# Patient Record
Sex: Female | Born: 1975 | Race: White | Hispanic: No | Marital: Single | Smoking: Light tobacco smoker
Health system: Southern US, Community
[De-identification: ages and names within clinical notes are randomized; demographics above are authoritative.]

## PROBLEM LIST (undated history)

## (undated) DIAGNOSIS — B977 Papillomavirus as the cause of diseases classified elsewhere: Secondary | ICD-10-CM

## (undated) HISTORY — PX: NO PAST SURGERIES: SHX2092

## (undated) HISTORY — DX: Papillomavirus as the cause of diseases classified elsewhere: B97.7

---

## 2016-04-18 ENCOUNTER — Encounter: Payer: Self-pay | Admitting: Obstetrics and Gynecology

## 2016-04-18 ENCOUNTER — Encounter: Payer: Self-pay | Admitting: Women's Health

## 2016-04-19 ENCOUNTER — Ambulatory Visit (INDEPENDENT_AMBULATORY_CARE_PROVIDER_SITE_OTHER): Payer: BLUE CROSS/BLUE SHIELD | Admitting: Obstetrics and Gynecology

## 2016-04-19 ENCOUNTER — Encounter: Payer: Self-pay | Admitting: Obstetrics and Gynecology

## 2016-04-19 VITALS — BP 104/65 | HR 74 | Ht 65.0 in | Wt 148.2 lb

## 2016-04-19 DIAGNOSIS — Z1231 Encounter for screening mammogram for malignant neoplasm of breast: Secondary | ICD-10-CM

## 2016-04-19 DIAGNOSIS — Z3009 Encounter for other general counseling and advice on contraception: Secondary | ICD-10-CM | POA: Diagnosis not present

## 2016-04-19 DIAGNOSIS — Z803 Family history of malignant neoplasm of breast: Secondary | ICD-10-CM | POA: Diagnosis not present

## 2016-04-19 DIAGNOSIS — Z01419 Encounter for gynecological examination (general) (routine) without abnormal findings: Secondary | ICD-10-CM

## 2016-04-19 DIAGNOSIS — Z1239 Encounter for other screening for malignant neoplasm of breast: Secondary | ICD-10-CM

## 2016-04-19 DIAGNOSIS — Z8041 Family history of malignant neoplasm of ovary: Secondary | ICD-10-CM

## 2016-04-19 NOTE — Progress Notes (Signed)
GYN ENCOUNTER NOTE  Subjective:       Melissa Benjamin is a 40 y.o. 612P1011 female is here for gynecologic evaluation of the following issues:  1. Annual well woman exam: Pt expresses no current concerns.    Menstrual History Interval- every 28 days Duration- 4 days Character- regular, light, no clots, BTB or dysmenorrhea   Gynecologic History Patient's last menstrual period was 03/28/2016. Contraception: withdrawl Last Pap: 01/21/2015. Results were: normal Last mammogram: 01/24/2013. Results were: normal  Obstetric History OB History  Gravida Para Term Preterm AB Living  2 1 1   1 1   SAB TAB Ectopic Multiple Live Births    1     1    # Outcome Date GA Lbr Len/2nd Weight Sex Delivery Anes PTL Lv  2 Term 2008   5 lb 3.2 oz (2.359 kg) F Vag-Spont   LIV  1 TAB 2002              Past Medical History:  Diagnosis Date  . HPV in female     Past Surgical History:  Procedure Laterality Date  . NO PAST SURGERIES      No current outpatient prescriptions on file prior to visit.   No current facility-administered medications on file prior to visit.     No Known Allergies  Social History   Social History  . Marital status: Single    Spouse name: N/A  . Number of children: N/A  . Years of education: N/A   Occupational History  . Not on file.   Social History Main Topics  . Smoking status: Light Tobacco Smoker    Packs/day: 0.25    Types: Cigarettes  . Smokeless tobacco: Never Used  . Alcohol use Yes     Comment: daily- wine  . Drug use: No  . Sexual activity: Yes    Birth control/ protection: None   Other Topics Concern  . Not on file   Social History Narrative  . No narrative on file    Family History  Problem Relation Age of Onset  . Breast cancer Mother   . Ovarian cancer Maternal Grandmother   . Cancer Paternal Grandmother   . Colon cancer Neg Hx   . Diabetes Neg Hx   . Heart disease Neg Hx     The following portions of the patient's history were  reviewed and updated as appropriate: allergies, current medications, past family history, past medical history, past social history, past surgical history and problem list.  Review of Systems Review of Systems - General ROS: negative for - chills, fatigue, fever, hot flashes, malaise or night sweats Hematological and Lymphatic ROS: negative for - bleeding problems or swollen lymph nodes Gastrointestinal ROS: negative for - abdominal pain, blood in stools, change in bowel habits and nausea/vomiting Musculoskeletal ROS: negative for - joint pain, muscle pain or muscular weakness Genito-Urinary ROS: negative for - change in menstrual cycle, dysmenorrhea, dyspareunia, dysuria, genital discharge, genital ulcers, hematuria, incontinence, irregular/heavy menses, nocturia or pelvic pain  Objective:   BP 104/65   Pulse 74   Ht 5\' 5"  (1.651 m)   Wt 148 lb 3.2 oz (67.2 kg)   LMP 03/28/2016   BMI 24.66 kg/m  CONSTITUTIONAL: Well-developed, well-nourished female in no acute distress.  HENT:  Normocephalic, atraumatic.  NECK: Normal range of motion, supple, no masses.  Normal thyroid.  SKIN: Skin is warm and dry. No rash noted. Not diaphoretic. No erythema. No pallor. NEUROLGIC: Alert and oriented to person,  place, and time. PSYCHIATRIC: Normal mood and affect. Normal behavior. Normal judgment and thought content. CARDIOVASCULAR:RRR, no murmurs, rubs or gallops RESPIRATORY: CTABL BREASTS: diffuse nodular fibrocystic changes throughout, no lumps or masses appreciated ABDOMEN: Soft, non distended; Non tender.  No Organomegaly. PELVIC:  External Genitalia: Normal  BUS: Normal  Vagina: Normal  Cervix: Normal; no cervical motion tenderness  Uterus: Normal size, shape,consistency, mobile; retroverted  Adnexa: Normal;nonpalpable and nontender  Bladder: Nontender  Rectovaginal: Normal external exam Extremities: Without clubbing cyanosis or edema      Assessment:   1. Well woman exam with  routine gynecological exam 2. Contraception- withdrawal- discussed other methods of contraception with the patient; decreased risk of ovarian cancer with OCPs but will not consider this method due to patient's smoking status. If patient does not use another form of birth control she should take a vitamin with folic acid as precaution if she were to get pregnant.   3. Family history of breast (mother and paternal grandmother) and ovarian (maternal grandmother) cancer 4. History of LGSIL pap- 2014, 2 negative pap smears since       Plan:   1. Pap IG and HPV (high risk) DNA detection 2. Discussed genetic testing for breast and ovarian cancer 3. Contraception- Discussed benefits and risks of different contraception methods. Educated to take a vitamin with folic acid if she chooses to continue withdrawal method. 4. Glucose, random; Hgb A1c; lipid panel; vit D 25 hydroxy, TSH 5. Counseled on diet, exercise, alcohol consumption and smoking cessation.  6. MM Digital Screening Bilateral- suggested annual screening per last radiology report.   Melissa HerterAnna Parr, PA-S Herold HarmsMartin A Maranda Marte, MD   I have seen, interviewed, and examined the patient in conjunction with the [facility] [employer] [employer] and affirm the diagnosis and management plan. Sinan Tuch A. Phi Avans, MD, FACOG   Note: This dictation was prepared with Dragon dictation along with smaller phrase technology. Any transcriptional errors that result from this process are unintentional.

## 2016-04-19 NOTE — Patient Instructions (Addendum)
1. Pap smear 2. Mammogram ordered 3. Contraception-withdrawal 4. Continue with healthy eating and exercise 5. Screening labs ordered 6. Return in 1 year   Health Maintenance, Female Adopting a healthy lifestyle and getting preventive care can go a long way to promote health and wellness. Talk with your health care provider about what schedule of regular examinations is right for you. This is a good chance for you to check in with your provider about disease prevention and staying healthy. In between checkups, there are plenty of things you can do on your own. Experts have done a lot of research about which lifestyle changes and preventive measures are most likely to keep you healthy. Ask your health care provider for more information. WEIGHT AND DIET  Eat a healthy diet  Be sure to include plenty of vegetables, fruits, low-fat dairy products, and lean protein.  Do not eat a lot of foods high in solid fats, added sugars, or salt.  Get regular exercise. This is one of the most important things you can do for your health.  Most adults should exercise for at least 150 minutes each week. The exercise should increase your heart rate and make you sweat (moderate-intensity exercise).  Most adults should also do strengthening exercises at least twice a week. This is in addition to the moderate-intensity exercise.  Maintain a healthy weight  Body mass index (BMI) is a measurement that can be used to identify possible weight problems. It estimates body fat based on height and weight. Your health care provider can help determine your BMI and help you achieve or maintain a healthy weight.  For females 6 years of age and older:   A BMI below 18.5 is considered underweight.  A BMI of 18.5 to 24.9 is normal.  A BMI of 25 to 29.9 is considered overweight.  A BMI of 30 and above is considered obese.  Watch levels of cholesterol and blood lipids  You should start having your blood tested for  lipids and cholesterol at 38 years of age, then have this test every 5 years.  You may need to have your cholesterol levels checked more often if:  Your lipid or cholesterol levels are high.  You are older than 38 years of age.  You are at high risk for heart disease.  CANCER SCREENING   Lung Cancer  Lung cancer screening is recommended for adults 43-60 years old who are at high risk for lung cancer because of a history of smoking.  A yearly low-dose CT scan of the lungs is recommended for people who:  Currently smoke.  Have quit within the past 15 years.  Have at least a 30-pack-year history of smoking. A pack year is smoking an average of one pack of cigarettes a day for 1 year.  Yearly screening should continue until it has been 15 years since you quit.  Yearly screening should stop if you develop a health problem that would prevent you from having lung cancer treatment.  Breast Cancer  Practice breast self-awareness. This means understanding how your breasts normally appear and feel.  It also means doing regular breast self-exams. Let your health care provider know about any changes, no matter how small.  If you are in your 20s or 30s, you should have a clinical breast exam (CBE) by a health care provider every 1-3 years as part of a regular health exam.  If you are 52 or older, have a CBE every year. Also consider having a breast  X-ray (mammogram) every year.  If you have a family history of breast cancer, talk to your health care provider about genetic screening.  If you are at high risk for breast cancer, talk to your health care provider about having an MRI and a mammogram every year.  Breast cancer gene (BRCA) assessment is recommended for women who have family members with BRCA-related cancers. BRCA-related cancers include:  Breast.  Ovarian.  Tubal.  Peritoneal cancers.  Results of the assessment will determine the need for genetic counseling and BRCA1  and BRCA2 testing. Cervical Cancer Your health care provider may recommend that you be screened regularly for cancer of the pelvic organs (ovaries, uterus, and vagina). This screening involves a pelvic examination, including checking for microscopic changes to the surface of your cervix (Pap test). You may be encouraged to have this screening done every 3 years, beginning at age 8.  For women ages 19-65, health care providers may recommend pelvic exams and Pap testing every 3 years, or they may recommend the Pap and pelvic exam, combined with testing for human papilloma virus (HPV), every 5 years. Some types of HPV increase your risk of cervical cancer. Testing for HPV may also be done on women of any age with unclear Pap test results.  Other health care providers may not recommend any screening for nonpregnant women who are considered low risk for pelvic cancer and who do not have symptoms. Ask your health care provider if a screening pelvic exam is right for you.  If you have had past treatment for cervical cancer or a condition that could lead to cancer, you need Pap tests and screening for cancer for at least 20 years after your treatment. If Pap tests have been discontinued, your risk factors (such as having a new sexual partner) need to be reassessed to determine if screening should resume. Some women have medical problems that increase the chance of getting cervical cancer. In these cases, your health care provider may recommend more frequent screening and Pap tests. Colorectal Cancer  This type of cancer can be detected and often prevented.  Routine colorectal cancer screening usually begins at 38 years of age and continues through 38 years of age.  Your health care provider may recommend screening at an earlier age if you have risk factors for colon cancer.  Your health care provider may also recommend using home test kits to check for hidden blood in the stool.  A small camera at the  end of a tube can be used to examine your colon directly (sigmoidoscopy or colonoscopy). This is done to check for the earliest forms of colorectal cancer.  Routine screening usually begins at age 29.  Direct examination of the colon should be repeated every 5-10 years through 38 years of age. However, you may need to be screened more often if early forms of precancerous polyps or small growths are found. Skin Cancer  Check your skin from head to toe regularly.  Tell your health care provider about any new moles or changes in moles, especially if there is a change in a mole's shape or color.  Also tell your health care provider if you have a mole that is larger than the size of a pencil eraser.  Always use sunscreen. Apply sunscreen liberally and repeatedly throughout the day.  Protect yourself by wearing long sleeves, pants, a wide-brimmed hat, and sunglasses whenever you are outside. HEART DISEASE, DIABETES, AND HIGH BLOOD PRESSURE   High blood pressure causes heart  disease and increases the risk of stroke. High blood pressure is more likely to develop in:  People who have blood pressure in the high end of the normal range (130-139/85-89 mm Hg).  People who are overweight or obese.  People who are African American.  If you are 24-59 years of age, have your blood pressure checked every 3-5 years. If you are 54 years of age or older, have your blood pressure checked every year. You should have your blood pressure measured twice--once when you are at a hospital or clinic, and once when you are not at a hospital or clinic. Record the average of the two measurements. To check your blood pressure when you are not at a hospital or clinic, you can use:  An automated blood pressure machine at a pharmacy.  A home blood pressure monitor.  If you are between 41 years and 59 years old, ask your health care provider if you should take aspirin to prevent strokes.  Have regular diabetes  screenings. This involves taking a blood sample to check your fasting blood sugar level.  If you are at a normal weight and have a low risk for diabetes, have this test once every three years after 38 years of age.  If you are overweight and have a high risk for diabetes, consider being tested at a younger age or more often. PREVENTING INFECTION  Hepatitis B  If you have a higher risk for hepatitis B, you should be screened for this virus. You are considered at high risk for hepatitis B if:  You were born in a country where hepatitis B is common. Ask your health care provider which countries are considered high risk.  Your parents were born in a high-risk country, and you have not been immunized against hepatitis B (hepatitis B vaccine).  You have HIV or AIDS.  You use needles to inject street drugs.  You live with someone who has hepatitis B.  You have had sex with someone who has hepatitis B.  You get hemodialysis treatment.  You take certain medicines for conditions, including cancer, organ transplantation, and autoimmune conditions. Hepatitis C  Blood testing is recommended for:  Everyone born from [birth year] through 1963.  Anyone with known risk factors for hepatitis C. Sexually transmitted infections (STIs)  You should be screened for sexually transmitted infections (STIs) including gonorrhea and chlamydia if:  You are sexually active and are younger than 38 years of age.  You are older than 38 years of age and your health care provider tells you that you are at risk for this type of infection.  Your sexual activity has changed since you were last screened and you are at an increased risk for chlamydia or gonorrhea. Ask your health care provider if you are at risk.  If you do not have HIV, but are at risk, it may be recommended that you take a prescription medicine daily to prevent HIV infection. This is called pre-exposure prophylaxis (PrEP). You are considered at risk  if:  You are sexually active and do not regularly use condoms or know the HIV status of your partner(s).  You take drugs by injection.  You are sexually active with a partner who has HIV. Talk with your health care provider about whether you are at high risk of being infected with HIV. If you choose to begin PrEP, you should first be tested for HIV. You should then be tested every 3 months for as long as you are taking  PrEP.  PREGNANCY   If you are premenopausal and you may become pregnant, ask your health care provider about preconception counseling.  If you may become pregnant, take 400 to 800 micrograms (mcg) of folic acid every day.  If you want to prevent pregnancy, talk to your health care provider about birth control (contraception). OSTEOPOROSIS AND MENOPAUSE   Osteoporosis is a disease in which the bones lose minerals and strength with aging. This can result in serious bone fractures. Your risk for osteoporosis can be identified using a bone density scan.  If you are 18 years of age or older, or if you are at risk for osteoporosis and fractures, ask your health care provider if you should be screened.  Ask your health care provider whether you should take a calcium or vitamin D supplement to lower your risk for osteoporosis.  Menopause may have certain physical symptoms and risks.  Hormone replacement therapy may reduce some of these symptoms and risks. Talk to your health care provider about whether hormone replacement therapy is right for you.  HOME CARE INSTRUCTIONS   Schedule regular health, dental, and eye exams.  Stay current with your immunizations.   Do not use any tobacco products including cigarettes, chewing tobacco, or electronic cigarettes.  If you are pregnant, do not drink alcohol.  If you are breastfeeding, limit how much and how often you drink alcohol.  Limit alcohol intake to no more than 1 drink per day for nonpregnant women. One drink equals 12  ounces of beer, 5 ounces of wine, or 1 ounces of hard liquor.  Do not use street drugs.  Do not share needles.  Ask your health care provider for help if you need support or information about quitting drugs.  Tell your health care provider if you often feel depressed.  Tell your health care provider if you have ever been abused or do not feel safe at home.   This information is not intended to replace advice given to you by your health care provider. Make sure you discuss any questions you have with your health care provider.   Document Released: 12/20/2010 Document Revised: 06/27/2014 Document Reviewed: 05/08/2013 Elsevier Interactive Patient Education [employer].

## 2016-04-19 NOTE — Progress Notes (Signed)
ANNUAL PREVENTATIVE CARE GYN  ENCOUNTER NOTE  Subjective:       Melissa Benjamin is a 40 y.o. 632P1011 female here for a routine annual gynecologic exam.  Current complaints:   see PA-S note   Gynecologic History Patient's last menstrual period was 03/28/2016. Contraception: none Last Pap: 2016. Results were: normal Last mammogram: n/a. Results were: ;  Obstetric History OB History  Gravida Para Term Preterm AB Living  2 1 1   1 1   SAB TAB Ectopic Multiple Live Births    1     1    # Outcome Date GA Lbr Len/2nd Weight Sex Delivery Anes PTL Lv  2 Term 2008   5 lb 3.2 oz (2.359 kg) F Vag-Spont   LIV  1 TAB 2002              Past Medical History:  Diagnosis Date  . HPV in female     Past Surgical History:  Procedure Laterality Date  . NO PAST SURGERIES      No current outpatient prescriptions on file prior to visit.   No current facility-administered medications on file prior to visit.     No Known Allergies  Social History   Social History  . Marital status: Single    Spouse name: N/A  . Number of children: N/A  . Years of education: N/A   Occupational History  . Not on file.   Social History Main Topics  . Smoking status: Not on file  . Smokeless tobacco: Not on file  . Alcohol use Not on file  . Drug use: Unknown  . Sexual activity: Not on file   Other Topics Concern  . Not on file   Social History Narrative  . No narrative on file    No family history on file.  The following portions of the patient's history were reviewed and updated as appropriate: allergies, current medications, past family history, past medical history, past social history, past surgical history and problem list.  Review of Systems ROS Review of Systems - General ROS: negative for - chills, fatigue, fever, hot flashes, night sweats, weight gain or weight loss Psychological ROS: negative for - anxiety, decreased libido, depression, mood swings, physical abuse or sexual  abuse Ophthalmic ROS: negative for - blurry vision, eye pain or loss of vision ENT ROS: negative for - headaches, hearing change, visual changes or vocal changes Allergy and Immunology ROS: negative for - hives, itchy/watery eyes or seasonal allergies Hematological and Lymphatic ROS: negative for - bleeding problems, bruising, swollen lymph nodes or weight loss Endocrine ROS: negative for - galactorrhea, hair pattern changes, hot flashes, malaise/lethargy, mood swings, palpitations, polydipsia/polyuria, skin changes, temperature intolerance or unexpected weight changes Breast ROS: negative for - new or changing breast lumps or nipple discharge Respiratory ROS: negative for - cough or shortness of breath Cardiovascular ROS: negative for - chest pain, irregular heartbeat, palpitations or shortness of breath Gastrointestinal ROS: no abdominal pain, change in bowel habits, or black or bloody stools Genito-Urinary ROS: no dysuria, trouble voiding, or hematuria Musculoskeletal ROS: negative for - joint pain or joint stiffness Neurological ROS: negative for - bowel and bladder control changes Dermatological ROS: negative for rash and skin lesion changes   Objective:   BP 104/65   Pulse 74   Ht 5\' 5"  (1.651 m)   Wt 148 lb 3.2 oz (67.2 kg)   LMP 03/28/2016   BMI 24.66 kg/m  see PA-S note    Assessment:  Annual gynecologic examination 40 y.o. Contraception: none Normal BMI Family history of breast cancer Family history ovarian cancer Contraception-withdrawal Tobacco user  Plan:  Pap: Pap Co Test Mammogram: due age 40 Stool Guaiac Testing:  Not indicated Labs: Lipid 1, FBS, TSH, Hemoglobin A1C and Vit D Level"". Routine preventative health maintenance measures emphasized: Exercise/Diet/Weight control, Tobacco Warnings, Alcohol/Substance use risks and Safe Sex [address]   Melissa Benjamin, CMA  Melissa HarmsMartin A Jady Braggs, MD  Note: This dictation was prepared with  Dragon dictation along with smaller phrase technology. Any transcriptional errors that result from this process are unintentional.  Please note: this is a duplicate note.for complete details go to PA-S documentation

## 2016-04-23 LAB — PAP IG AND HPV HIGH-RISK
HPV, HIGH-RISK: NEGATIVE
PAP SMEAR COMMENT: 0

## 2017-04-13 NOTE — Progress Notes (Signed)
GYN ENCOUNTER NOTE  Subjective:       Melissa Benjamin is a [age] y.o. 642P1011 female is here for gynecologic evaluation of the following issues:  1. Annual well woman exam: Pt expresses no current concerns.    Menstrual History Interval- every 28 days Duration- 4 days Character- regular, light, no clots, BTB or dysmenorrhea   Gynecologic History lmp- 03/28/2017 Contraception: Condoms (monogamous times 17 years) Last Pap: 04/19/2016 neg/neg Results were: normal Last mammogram: 01/24/2013. Results were: normal  Obstetric History OB History  Gravida Para Term Preterm AB Living  2 1 1   1 1   SAB TAB Ectopic Multiple Live Births    1     1    # Outcome Date GA Lbr Len/2nd Weight Sex Delivery Anes PTL Lv  2 Term 2008   5 lb 3.2 oz (2.359 kg) F Vag-Spont   LIV  1 TAB 2002              Past Medical History:  Diagnosis Date  . HPV in female     Past Surgical History:  Procedure Laterality Date  . NO PAST SURGERIES      No current outpatient prescriptions on file prior to visit.   No current facility-administered medications on file prior to visit.     No Known Allergies  Social History   Social History  . Marital status: Single    Spouse name: N/A  . Number of children: N/A  . Years of education: N/A   Occupational History  . Not on file.   Social History Main Topics  . Smoking status: Light Tobacco Smoker    Packs/day: 0.25    Types: Cigarettes  . Smokeless tobacco: Never Used  . Alcohol use Yes     Comment: daily- wine  . Drug use: No  . Sexual activity: Yes    Birth control/ protection: None   Other Topics Concern  . Not on file   Social History Narrative  . No narrative on file    Family History  Problem Relation Age of Onset  . Breast cancer Mother   . Ovarian cancer Maternal Grandmother   . Cancer Paternal Grandmother   . Colon cancer Neg Hx   . Diabetes Neg Hx   . Heart disease Neg Hx     The following portions of the patient's history  were reviewed and updated as appropriate: allergies, current medications, past family history, past medical history, past social history, past surgical history and problem list.  Review of Systems Review of Systems  Constitutional: Negative.   HENT: Negative.   Eyes: Negative.   Respiratory: Negative.   Cardiovascular: Negative.   Gastrointestinal: Negative.   Genitourinary: Negative.   Musculoskeletal: Negative.   Skin: Negative.   Neurological: Negative.   Endo/Heme/Allergies: Negative.   Psychiatric/Behavioral: Negative.        Objective:   BP 106/72   Pulse 71   Ht 5\' 9"  (1.753 m)   Wt 152 lb 1.6 oz (69 kg)   LMP 03/28/2017 (Exact Date)   BMI 22.46 kg/m  CONSTITUTIONAL: Well-developed, well-nourished female in no acute distress.  HENT:  Normocephalic, atraumatic.  NECK: Normal range of motion, supple, no masses.  Normal thyroid.  SKIN: Skin is warm and dry. No rash noted. Not diaphoretic. No erythema. No pallor. NEUROLGIC: Alert and oriented to person, place, and time. PSYCHIATRIC: Normal mood and affect. Normal behavior. Normal judgment and thought content. CARDIOVASCULAR:RRR, no murmurs, rubs or gallops RESPIRATORY: CTABL BREASTS:  diffuse nodular fibrocystic changes throughout, no lumps or masses appreciated ABDOMEN: Soft, non distended; Non tender.  No Organomegaly. PELVIC:  External Genitalia: Normal  BUS: Normal  Vagina: Normal  Cervix: Normal; no cervical motion tenderness  Uterus: Normal size, shape,consistency, mobile; retroverted  Adnexa: Normal;nonpalpable and nontender  Bladder: Nontender  Rectovaginal: Normal external exam Extremities: Without clubbing cyanosis or edema      Assessment:   1. Well woman exam with routine gynecological exam 2. Contraception-condoms/withdrawal-  3. Family history of breast (mother and paternal grandmother) and ovarian (maternal grandmother) cancer 4. History of LGSIL pap- 2014, 2 negative pap smears since        Plan:   1. Due 2020 2. Discussed genetic testing for breast and ovarian cancer; patient to contact us to schedule blood work 3. Contraception- Discussed benefits and risks of different contraception methods. Educated to take a vitamin with folic acid if she chooses to continue withdrawal method. 4. Labs - declined 5. Counseled on diet, exercise, alcohol consumption and smoking cessation.  6. MM Digital Screening Bilateral- suggested annual screening per last radiology report.  Darol Destine, CMA  Herold Harms, MD  Note: This dictation was prepared with Dragon dictation along with smaller phrase technology. Any transcriptional errors that result from this process are unintentional.

## 2017-04-20 ENCOUNTER — Ambulatory Visit (INDEPENDENT_AMBULATORY_CARE_PROVIDER_SITE_OTHER): Payer: BLUE CROSS/BLUE SHIELD | Admitting: Obstetrics and Gynecology

## 2017-04-20 ENCOUNTER — Encounter: Payer: Self-pay | Admitting: Obstetrics and Gynecology

## 2017-04-20 VITALS — BP 106/72 | HR 71 | Ht 69.0 in | Wt 152.1 lb

## 2017-04-20 DIAGNOSIS — Z01419 Encounter for gynecological examination (general) (routine) without abnormal findings: Secondary | ICD-10-CM | POA: Diagnosis not present

## 2017-04-20 DIAGNOSIS — Z803 Family history of malignant neoplasm of breast: Secondary | ICD-10-CM | POA: Diagnosis not present

## 2017-04-20 DIAGNOSIS — Z1231 Encounter for screening mammogram for malignant neoplasm of breast: Secondary | ICD-10-CM | POA: Diagnosis not present

## 2017-04-20 DIAGNOSIS — Z8041 Family history of malignant neoplasm of ovary: Secondary | ICD-10-CM | POA: Diagnosis not present

## 2017-04-20 DIAGNOSIS — Z1239 Encounter for other screening for malignant neoplasm of breast: Secondary | ICD-10-CM

## 2017-04-20 NOTE — Patient Instructions (Signed)
1.  No Pap smear today.  Next Pap smear is due in 2020 2.  Mammogram is ordered 3.  Screening labs are declined this year 4.  Genetic testing for breast cancer and ovarian cancer was reviewed.  Patient is to call us with her decision about which company to do the testing with 5.  Continue with healthy eating and exercise 6.  Return in 1 year for annual exam  Health Maintenance, Female Adopting a healthy lifestyle and getting preventive care can go a long way to promote health and wellness. Talk with your health care provider about what schedule of regular examinations is right for you. This is a good chance for you to check in with your provider about disease prevention and staying healthy. In between checkups, there are plenty of things you can do on your own. Experts have done a lot of research about which lifestyle changes and preventive measures are most likely to keep you healthy. Ask your health care provider for more information. Weight and diet Eat a healthy diet  Be sure to include plenty of vegetables, fruits, low-fat dairy products, and lean protein.  Do not eat a lot of foods high in solid fats, added sugars, or salt.  Get regular exercise. This is one of the most important things you can do for your health. ? Most adults should exercise for at least 150 minutes each week. The exercise should increase your heart rate and make you sweat (moderate-intensity exercise). ? Most adults should also do strengthening exercises at least twice a week. This is in addition to the moderate-intensity exercise.  Maintain a healthy weight  Body mass index (BMI) is a measurement that can be used to identify possible weight problems. It estimates body fat based on height and weight. Your health care provider can help determine your BMI and help you achieve or maintain a healthy weight.  For females 25 years of age and older: ? A BMI below 18.5 is considered underweight. ? A BMI of 18.5 to 24.9 is  normal. ? A BMI of 25 to 29.9 is considered overweight. ? A BMI of 30 and above is considered obese.  Watch levels of cholesterol and blood lipids  You should start having your blood tested for lipids and cholesterol at 39 years of age, then have this test every 5 years.  You may need to have your cholesterol levels checked more often if: ? Your lipid or cholesterol levels are high. ? You are older than 39 years of age. ? You are at high risk for heart disease.  Cancer screening Lung Cancer  Lung cancer screening is recommended for adults 77-2 years old who are at high risk for lung cancer because of a history of smoking.  A yearly low-dose CT scan of the lungs is recommended for people who: ? Currently smoke. ? Have quit within the past 15 years. ? Have at least a 30-pack-year history of smoking. A pack year is smoking an average of one pack of cigarettes a day for 1 year.  Yearly screening should continue until it has been 15 years since you quit.  Yearly screening should stop if you develop a health problem that would prevent you from having lung cancer treatment.  Breast Cancer  Practice breast self-awareness. This means understanding how your breasts normally appear and feel.  It also means doing regular breast self-exams. Let your health care provider know about any changes, no matter how small.  If you are in  your 20s or 30s, you should have a clinical breast exam (CBE) by a health care provider every 1-3 years as part of a regular health exam.  If you are 70 or older, have a CBE every year. Also consider having a breast X-ray (mammogram) every year.  If you have a family history of breast cancer, talk to your health care provider about genetic screening.  If you are at high risk for breast cancer, talk to your health care provider about having an MRI and a mammogram every year.  Breast cancer gene (BRCA) assessment is recommended for women who have family members  with BRCA-related cancers. BRCA-related cancers include: ? Breast. ? Ovarian. ? Tubal. ? Peritoneal cancers.  Results of the assessment will determine the need for genetic counseling and BRCA1 and BRCA2 testing.  Cervical Cancer Your health care provider may recommend that you be screened regularly for cancer of the pelvic organs (ovaries, uterus, and vagina). This screening involves a pelvic examination, including checking for microscopic changes to the surface of your cervix (Pap test). You may be encouraged to have this screening done every 3 years, beginning at age 70.  For women ages 38-65, health care providers may recommend pelvic exams and Pap testing every 3 years, or they may recommend the Pap and pelvic exam, combined with testing for human papilloma virus (HPV), every 5 years. Some types of HPV increase your risk of cervical cancer. Testing for HPV may also be done on women of any age with unclear Pap test results.  Other health care providers may not recommend any screening for nonpregnant women who are considered low risk for pelvic cancer and who do not have symptoms. Ask your health care provider if a screening pelvic exam is right for you.  If you have had past treatment for cervical cancer or a condition that could lead to cancer, you need Pap tests and screening for cancer for at least 20 years after your treatment. If Pap tests have been discontinued, your risk factors (such as having a new sexual partner) need to be reassessed to determine if screening should resume. Some women have medical problems that increase the chance of getting cervical cancer. In these cases, your health care provider may recommend more frequent screening and Pap tests.  Colorectal Cancer  This type of cancer can be detected and often prevented.  Routine colorectal cancer screening usually begins at 39 years of age and continues through 39 years of age.  Your health care provider may recommend  screening at an earlier age if you have risk factors for colon cancer.  Your health care provider may also recommend using home test kits to check for hidden blood in the stool.  A small camera at the end of a tube can be used to examine your colon directly (sigmoidoscopy or colonoscopy). This is done to check for the earliest forms of colorectal cancer.  Routine screening usually begins at age 64.  Direct examination of the colon should be repeated every 5-10 years through 39 years of age. However, you may need to be screened more often if early forms of precancerous polyps or small growths are found.  Skin Cancer  Check your skin from head to toe regularly.  Tell your health care provider about any new moles or changes in moles, especially if there is a change in a mole's shape or color.  Also tell your health care provider if you have a mole that is larger than the size  of a pencil eraser.  Always use sunscreen. Apply sunscreen liberally and repeatedly throughout the day.  Protect yourself by wearing long sleeves, pants, a wide-brimmed hat, and sunglasses whenever you are outside.  Heart disease, diabetes, and high blood pressure  High blood pressure causes heart disease and increases the risk of stroke. High blood pressure is more likely to develop in: ? People who have blood pressure in the high end of the normal range (130-139/85-89 mm Hg). ? People who are overweight or obese. ? People who are African American.  If you are 70-8 years of age, have your blood pressure checked every 3-5 years. If you are 71 years of age or older, have your blood pressure checked every year. You should have your blood pressure measured twice-once when you are at a hospital or clinic, and once when you are not at a hospital or clinic. Record the average of the two measurements. To check your blood pressure when you are not at a hospital or clinic, you can use: ? An automated blood pressure machine at  a pharmacy. ? A home blood pressure monitor.  If you are between 52 years and 67 years old, ask your health care provider if you should take aspirin to prevent strokes.  Have regular diabetes screenings. This involves taking a blood sample to check your fasting blood sugar level. ? If you are at a normal weight and have a low risk for diabetes, have this test once every three years after 39 years of age. ? If you are overweight and have a high risk for diabetes, consider being tested at a younger age or more often. Preventing infection Hepatitis B  If you have a higher risk for hepatitis B, you should be screened for this virus. You are considered at high risk for hepatitis B if: ? You were born in a country where hepatitis B is common. Ask your health care provider which countries are considered high risk. ? Your parents were born in a high-risk country, and you have not been immunized against hepatitis B (hepatitis B vaccine). ? You have HIV or AIDS. ? You use needles to inject street drugs. ? You live with someone who has hepatitis B. ? You have had sex with someone who has hepatitis B. ? You get hemodialysis treatment. ? You take certain medicines for conditions, including cancer, organ transplantation, and autoimmune conditions.  Hepatitis C  Blood testing is recommended for: ? Everyone born from 1 through 1963. ? Anyone with known risk factors for hepatitis C.  Sexually transmitted infections (STIs)  You should be screened for sexually transmitted infections (STIs) including gonorrhea and chlamydia if: ? You are sexually active and are younger than 39 years of age. ? You are older than 39 years of age and your health care provider tells you that you are at risk for this type of infection. ? Your sexual activity has changed since you were last screened and you are at an increased risk for chlamydia or gonorrhea. Ask your health care provider if you are at risk.  If you do  not have HIV, but are at risk, it may be recommended that you take a prescription medicine daily to prevent HIV infection. This is called pre-exposure prophylaxis (PrEP). You are considered at risk if: ? You are sexually active and do not regularly use condoms or know the HIV status of your partner(s). ? You take drugs by injection. ? You are sexually active with a partner who  has HIV.  Talk with your health care provider about whether you are at high risk of being infected with HIV. If you choose to begin PrEP, you should first be tested for HIV. You should then be tested every 3 months for as long as you are taking PrEP. Pregnancy  If you are premenopausal and you may become pregnant, ask your health care provider about preconception counseling.  If you may become pregnant, take 400 to 800 micrograms (mcg) of folic acid every day.  If you want to prevent pregnancy, talk to your health care provider about birth control (contraception). Osteoporosis and menopause  Osteoporosis is a disease in which the bones lose minerals and strength with aging. This can result in serious bone fractures. Your risk for osteoporosis can be identified using a bone density scan.  If you are 40 years of age or older, or if you are at risk for osteoporosis and fractures, ask your health care provider if you should be screened.  Ask your health care provider whether you should take a calcium or vitamin D supplement to lower your risk for osteoporosis.  Menopause may have certain physical symptoms and risks.  Hormone replacement therapy may reduce some of these symptoms and risks. Talk to your health care provider about whether hormone replacement therapy is right for you. Follow these instructions at home:  Schedule regular health, dental, and eye exams.  Stay current with your immunizations.  Do not use any tobacco products including cigarettes, chewing tobacco, or electronic cigarettes.  If you are  pregnant, do not drink alcohol.  If you are breastfeeding, limit how much and how often you drink alcohol.  Limit alcohol intake to no more than 1 drink per day for nonpregnant women. One drink equals 12 ounces of beer, 5 ounces of wine, or 1 ounces of hard liquor.  Do not use street drugs.  Do not share needles.  Ask your health care provider for help if you need support or information about quitting drugs.  Tell your health care provider if you often feel depressed.  Tell your health care provider if you have ever been abused or do not feel safe at home. This information is not intended to replace advice given to you by your health care provider. Make sure you discuss any questions you have with your health care provider. Document Released: 12/20/2010 Document Revised: 11/12/2015 Document Reviewed: 03/10/2015 Elsevier Interactive Patient Education  [employer].

## 2018-01-30 ENCOUNTER — Encounter: Payer: Self-pay | Admitting: Radiology

## 2018-01-30 ENCOUNTER — Ambulatory Visit
Admission: RE | Admit: 2018-01-30 | Discharge: 2018-01-30 | Disposition: A | Discharge status: Home / Self Care | Payer: BLUE CROSS/BLUE SHIELD | Source: Ambulatory Visit | Attending: Obstetrics and Gynecology | Admitting: Obstetrics and Gynecology

## 2018-01-30 DIAGNOSIS — Z1231 Encounter for screening mammogram for malignant neoplasm of breast: Secondary | ICD-10-CM | POA: Diagnosis not present

## 2018-01-30 DIAGNOSIS — Z1239 Encounter for other screening for malignant neoplasm of breast: Secondary | ICD-10-CM

## 2018-01-31 ENCOUNTER — Other Ambulatory Visit: Payer: Self-pay | Admitting: Obstetrics and Gynecology

## 2018-01-31 DIAGNOSIS — N6310 Unspecified lump in the right breast, unspecified quadrant: Secondary | ICD-10-CM

## 2018-01-31 DIAGNOSIS — R928 Other abnormal and inconclusive findings on diagnostic imaging of breast: Secondary | ICD-10-CM

## 2018-02-13 ENCOUNTER — Ambulatory Visit
Admission: RE | Admit: 2018-02-13 | Discharge: 2018-02-13 | Disposition: A | Discharge status: Home / Self Care | Payer: BLUE CROSS/BLUE SHIELD | Source: Ambulatory Visit | Attending: Obstetrics and Gynecology | Admitting: Obstetrics and Gynecology

## 2018-02-13 DIAGNOSIS — R928 Other abnormal and inconclusive findings on diagnostic imaging of breast: Secondary | ICD-10-CM | POA: Diagnosis not present

## 2018-02-13 DIAGNOSIS — N6001 Solitary cyst of right breast: Secondary | ICD-10-CM | POA: Diagnosis not present

## 2018-02-13 DIAGNOSIS — N6310 Unspecified lump in the right breast, unspecified quadrant: Secondary | ICD-10-CM

## 2018-04-24 ENCOUNTER — Encounter: Payer: BLUE CROSS/BLUE SHIELD | Admitting: Obstetrics and Gynecology

## 2018-11-03 IMAGING — MG MM DIGITAL DIAGNOSTIC UNILAT*R* W/ TOMO W/ CAD
4 series · 4 of 12 positions shown · non-contrast
Comparison: 01/30/2018

CLINICAL DATA: 42-year-old patient recalled from recent screening
mammogram for evaluation of possible mass in the medial right
breast.

EXAM:
DIGITAL DIAGNOSTIC RIGHT MAMMOGRAM WITH CAD AND TOMO
ULTRASOUND RIGHT BREAST

[R CC synth-2D]
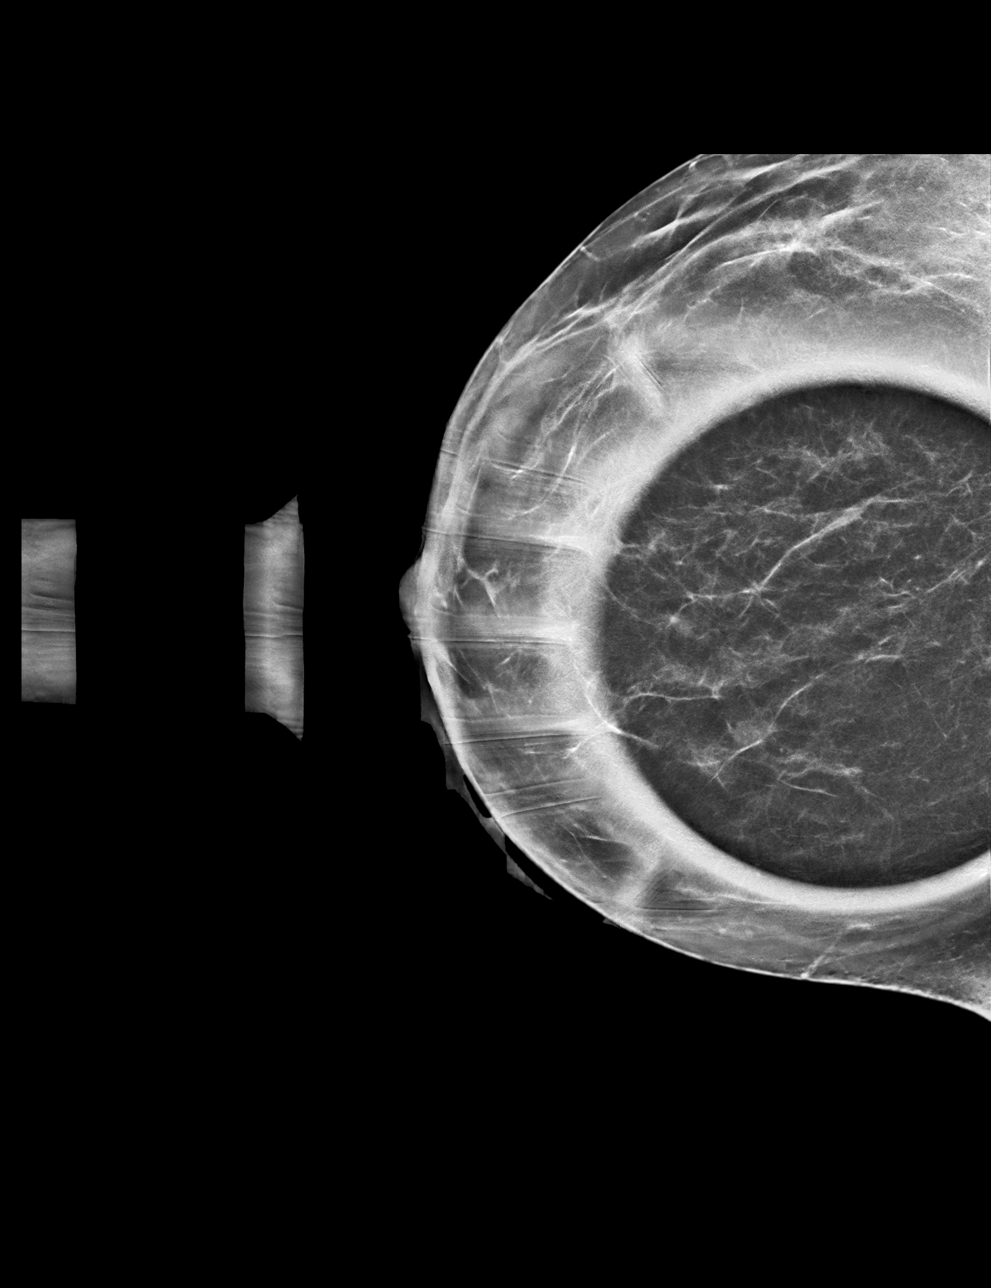

[R ML synth-2D]
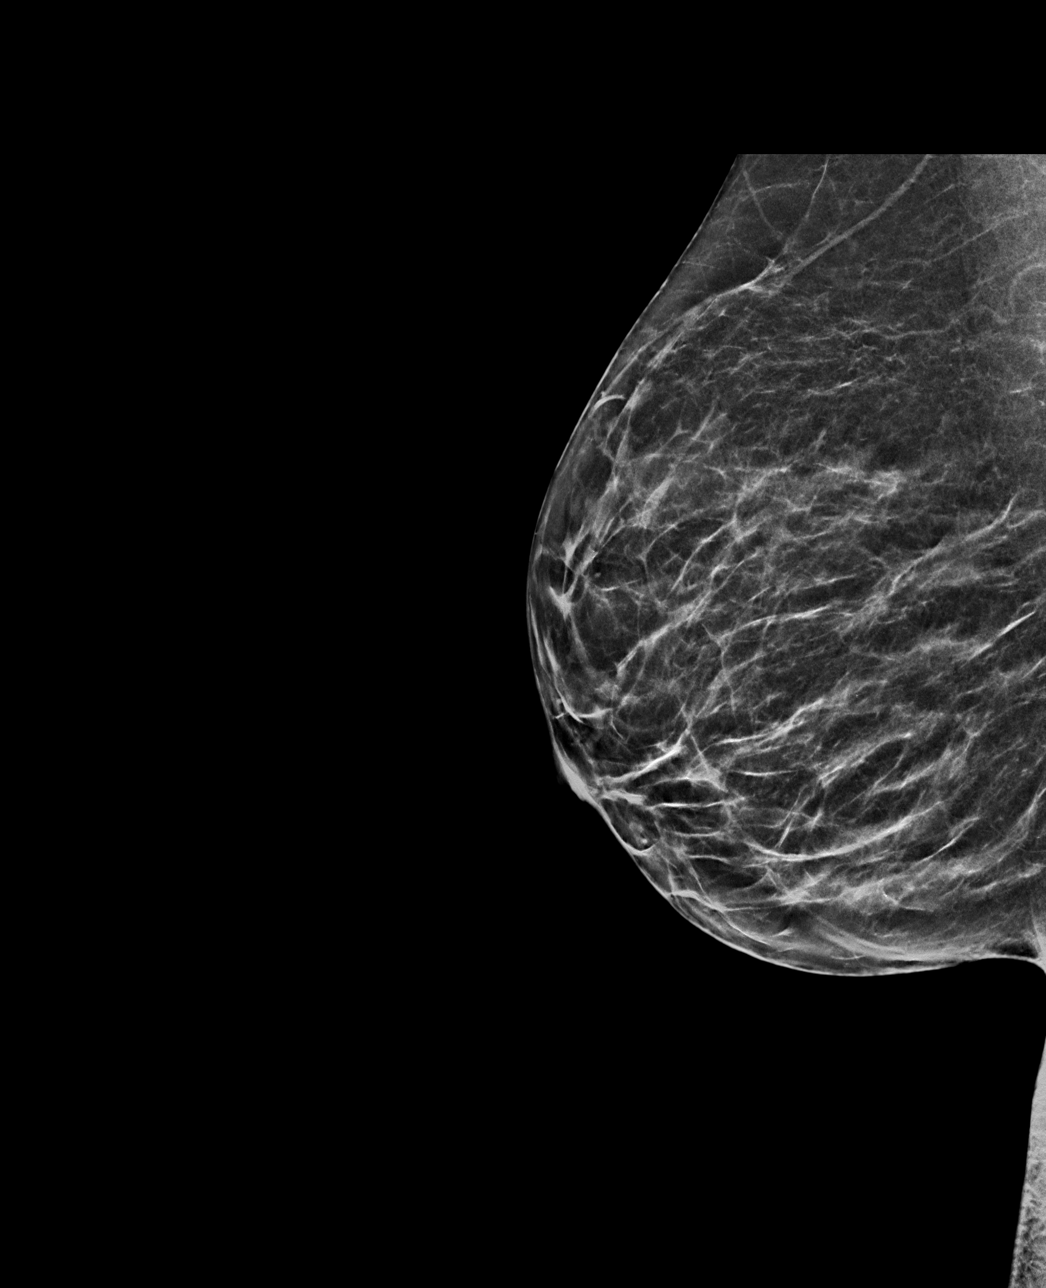

[R CC tomo · tomo slice 29/56.0]
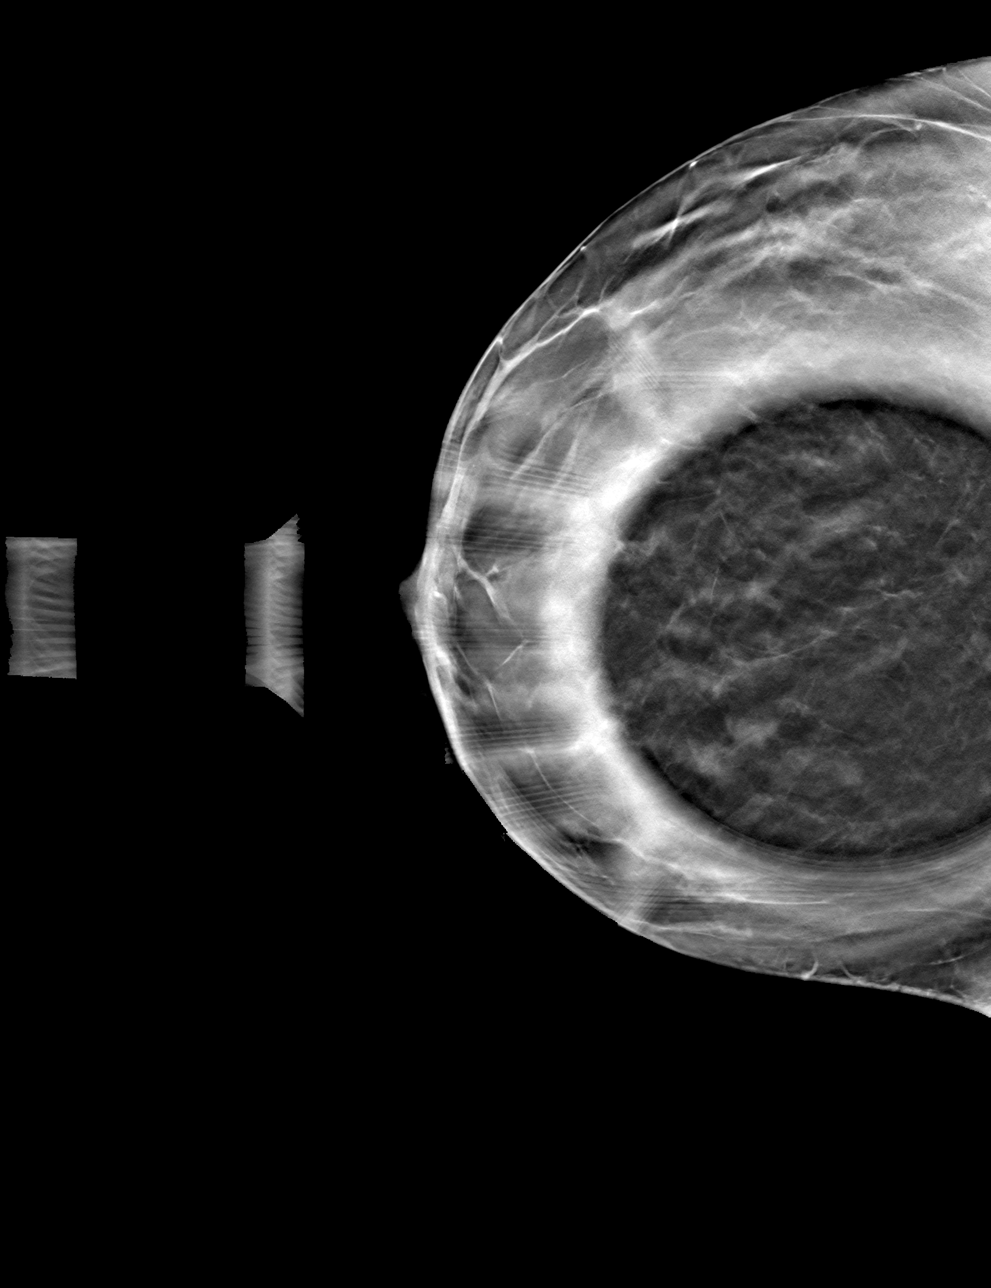

[R ML tomo · tomo slice 27/54.0]
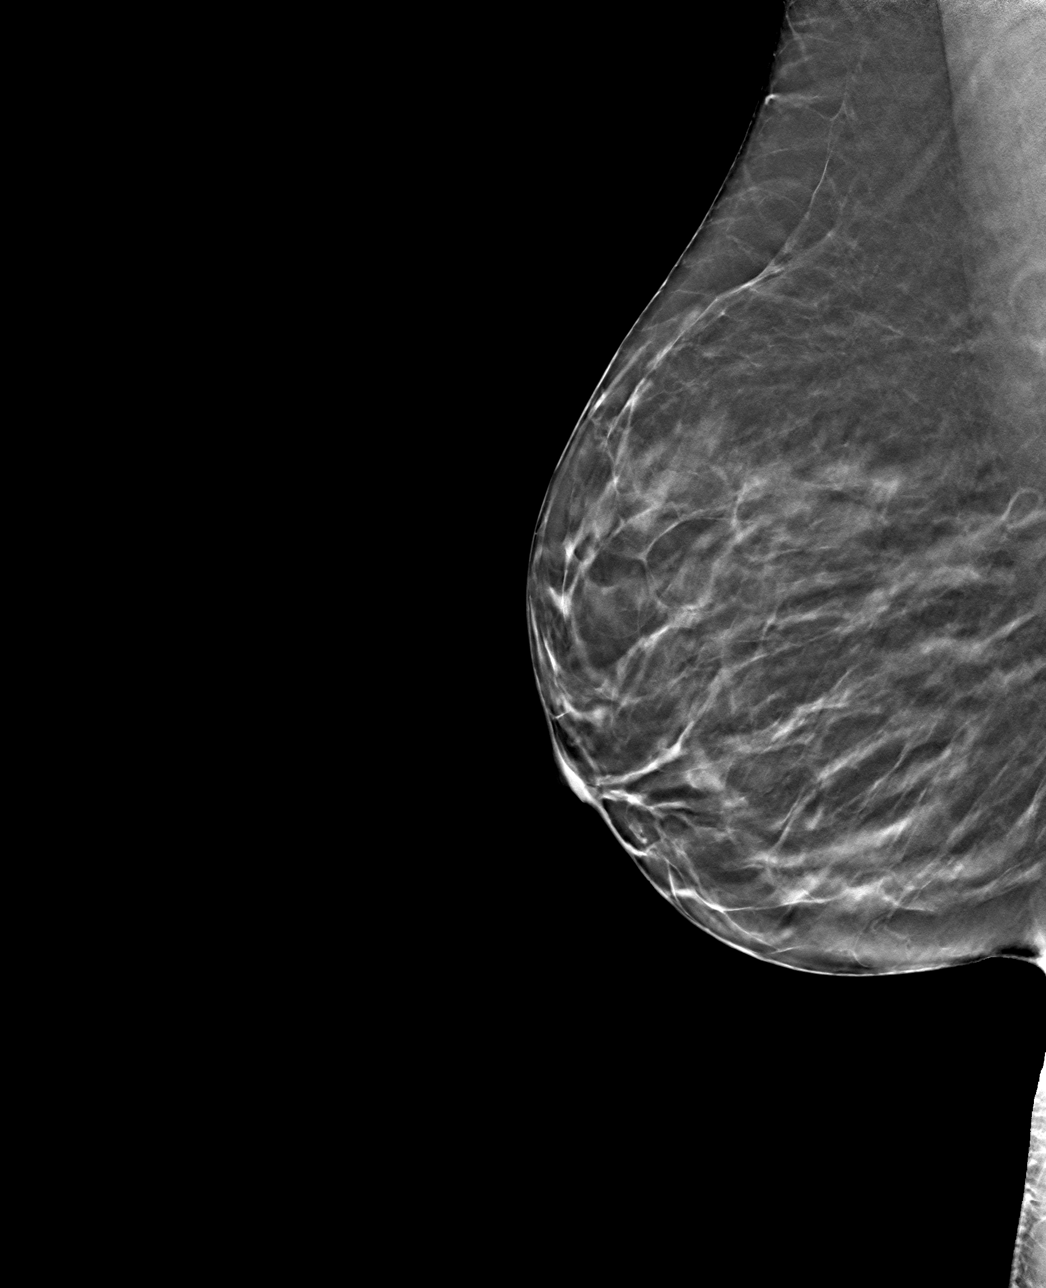

[4 of 12 positions shown; findings below may reference images not displayed]

ACR Breast Density Category b: There are scattered areas of
fibroglandular density.
FINDINGS: Spot compression view of the medial right breast and a 90 degree
lateral view of the right breast confirm an approximately 6 mm
circumscribed low-density oval mass in the upper inner quadrant of
the right breast.

Mammographic images were processed with CAD.

Targeted ultrasound is performed, showing a circumscribed oval
cystic mass with thin internal echogenic septations at 1 o'clock
position 4 cm from the nipple. The mass measures 0.7 x 0.3 x 0.5 cm.
There is no associated vascular flow internally. Ultrasound
appearances are most consistent with a cluster of cysts.
IMPRESSION: Benign cluster of cyst 1 o'clock position of the right breast. No
evidence of malignancy.

RECOMMENDATION:
Screening mammogram in one year.(Code:[id number]-[id number])

I have discussed the findings and recommendations with the patient.
Results were also provided in writing at the conclusion of the
visit. If applicable, a reminder letter will be sent to the patient
regarding the next appointment.

BI-RADS CATEGORY  2: Benign.

## 2019-08-26 ENCOUNTER — Encounter: Payer: BLUE CROSS/BLUE SHIELD | Admitting: Certified Nurse Midwife

## 2019-10-03 ENCOUNTER — Ambulatory Visit (INDEPENDENT_AMBULATORY_CARE_PROVIDER_SITE_OTHER): Payer: BC Managed Care – PPO | Admitting: Certified Nurse Midwife

## 2019-10-03 ENCOUNTER — Other Ambulatory Visit: Payer: Self-pay

## 2019-10-03 ENCOUNTER — Encounter: Payer: Self-pay | Admitting: Certified Nurse Midwife

## 2019-10-03 VITALS — BP 134/86 | HR 67 | Ht 69.0 in | Wt 153.0 lb

## 2019-10-03 DIAGNOSIS — Z01419 Encounter for gynecological examination (general) (routine) without abnormal findings: Secondary | ICD-10-CM | POA: Diagnosis not present

## 2019-10-03 DIAGNOSIS — Z1231 Encounter for screening mammogram for malignant neoplasm of breast: Secondary | ICD-10-CM

## 2019-10-03 DIAGNOSIS — Z803 Family history of malignant neoplasm of breast: Secondary | ICD-10-CM | POA: Diagnosis not present

## 2019-10-03 DIAGNOSIS — Z8041 Family history of malignant neoplasm of ovary: Secondary | ICD-10-CM

## 2019-10-03 NOTE — Progress Notes (Signed)
ANNUAL PREVENTATIVE CARE GYN  ENCOUNTER NOTE  Subjective:       Melissa Benjamin is a 44 y.o. G31P1011 female here for a routine annual gynecologic exam.  Current complaints: 1. Requests yearly labs 2. Needs screening mammogram  Received second COVID vaccine today.   Denies difficulty breathing or respiratory distress, chest pain, abdominal pain, excessive vaginal bleeding, dysuria, and leg pain or swelling.    Gynecologic History  Patient's last menstrual period was 09/16/2019 (approximate). Period Duration (Days): Five (5) Period Pattern: Regular Menstrual Control: Tampon Menstrual Control Change Freq (Hours): Six (6) to eight (8)  Contraception: condoms  Last Pap: 03/2016. Results were: Neg/Neg  Last mammogram: 01/2018. Results were: BI-RADS 1  Obstetric History  OB History  Gravida Para Term Preterm AB Living  2 1 1   1 1   SAB TAB Ectopic Multiple Live Births    1     1    # Outcome Date GA Lbr Len/2nd Weight Sex Delivery Anes PTL Lv  2 Term 2008   5 lb 3.2 oz (2.359 kg) F Vag-Spont   LIV  1 TAB 2002            Past Medical History:  Diagnosis Date  . HPV in female     Past Surgical History:  Procedure Laterality Date  . NO PAST SURGERIES     No Known Allergies  Social History   Socioeconomic History  . Marital status: Single    Spouse name: Not on file  . Number of children: Not on file  . Years of education: Not on file  . Highest education level: Not on file  Occupational History  . Not on file  Tobacco Use  . Smoking status: Light Tobacco Smoker    Packs/day: 0.25    Types: Cigarettes  . Smokeless tobacco: Never Used  Substance and Sexual Activity  . Alcohol use: Yes    Comment: daily- wine  . Drug use: No  . Sexual activity: Yes    Birth control/protection: None  Other Topics Concern  . Not on file  Social History Narrative  . Not on file   Social Determinants of Health   Financial Resource Strain:   . Difficulty of Paying Living  Expenses:   Food Insecurity:   . Worried About Charity fundraiser in the Last Year:   . Arboriculturist in the Last Year:   Transportation Needs:   . Film/video editor (Medical):   Marland Kitchen Lack of Transportation (Non-Medical):   Physical Activity:   . Days of Exercise per Week:   . Minutes of Exercise per Session:   Stress:   . Feeling of Stress :   Social Connections:   . Frequency of Communication with Friends and Family:   . Frequency of Social Gatherings with Friends and Family:   . Attends Religious Services:   . Active Member of Clubs or Organizations:   . Attends Archivist Meetings:   Marland Kitchen Marital Status:   Intimate Partner Violence:   . Fear of Current or Ex-Partner:   . Emotionally Abused:   Marland Kitchen Physically Abused:   . Sexually Abused:     Family History  Problem Relation Age of Onset  . Breast cancer Mother        late 66's  . Ovarian cancer Maternal Grandmother 60  . Cancer Paternal Grandmother   . Breast cancer Paternal Grandmother        late 20's  . Colon  cancer Neg Hx   . Diabetes Neg Hx   . Heart disease Neg Hx     The following portions of the patient's history were reviewed and updated as appropriate: allergies, current medications, past family history, past medical history, past social history, past surgical history and problem list.  Review of Systems  ROS negative except as noted above. Information obtained from patient.    Objective:   BP 134/86   Pulse 67   Ht 5\' 9"  (1.753 m)   Wt 153 lb (69.4 kg)   LMP 09/16/2019 (Approximate)   BMI 22.59 kg/m    CONSTITUTIONAL: Well-developed, well-nourished female in no acute distress.   PSYCHIATRIC: Normal mood and affect. Normal behavior. Normal judgment and thought content.  NEUROLGIC: Alert and oriented to person, place, and time. Normal muscle tone coordination. No cranial nerve deficit noted.  HENT:  Normocephalic, atraumatic, External right and left ear normal.   EYES: Conjunctivae  and EOM are normal. Pupils are equal and round.   NECK: Normal range of motion, supple, no masses.  Normal thyroid.   SKIN: Skin is warm and dry. No rash noted. Not diaphoretic. No erythema. No pallor.  CARDIOVASCULAR: Normal heart rate noted, regular rhythm, no murmur.  RESPIRATORY: Clear to auscultation bilaterally. Effort and breath sounds normal, no problems with respiration noted.  BREASTS: Symmetric in size. No masses, skin changes, nipple drainage, or lymphadenopathy.  ABDOMEN: Soft, normal bowel sounds, no distention noted.  No tenderness, rebound or guarding.   PELVIC: Declined exam.   MUSCULOSKELETAL: Normal range of motion. No tenderness.  No cyanosis, clubbing, or edema.  2+ distal pulses.  Assessment:   Annual gynecologic examination 44 y.o.   Contraception: condoms   Normal BMI   Problem List Items Addressed This Visit    None      Plan:   Pap: Not needed  Mammogram: Ordered; Advised to wait six (6) to eight (8) weeks due to recent COVID vaccine  Labs: See orders   Routine preventative health maintenance measures emphasized: Exercise/Diet/Weight control, Tobacco Warnings, Alcohol/Substance use risks and Stress Management; see AVS  Reviewed red flag symptoms and when to call  Return to Clinic - 1 Year for 45 or sooner if needed   [facility], [facility]  [employer]   [facility], CNM [facility], [facility]

## 2019-10-03 NOTE — Patient Instructions (Signed)

## 2019-10-21 ENCOUNTER — Other Ambulatory Visit: Payer: BC Managed Care – PPO

## 2020-06-23 ENCOUNTER — Ambulatory Visit
Admission: RE | Admit: 2020-06-23 | Discharge: 2020-06-23 | Disposition: A | Discharge status: Home / Self Care | Payer: BC Managed Care – PPO | Source: Ambulatory Visit | Attending: Certified Nurse Midwife | Admitting: Certified Nurse Midwife

## 2020-06-23 ENCOUNTER — Other Ambulatory Visit: Payer: Self-pay

## 2020-06-23 DIAGNOSIS — Z01419 Encounter for gynecological examination (general) (routine) without abnormal findings: Secondary | ICD-10-CM | POA: Insufficient documentation

## 2020-06-23 DIAGNOSIS — Z803 Family history of malignant neoplasm of breast: Secondary | ICD-10-CM | POA: Insufficient documentation

## 2020-06-23 DIAGNOSIS — Z1231 Encounter for screening mammogram for malignant neoplasm of breast: Secondary | ICD-10-CM | POA: Diagnosis present

## 2020-10-19 ENCOUNTER — Encounter: Payer: BC Managed Care – PPO | Admitting: Certified Nurse Midwife

## 2020-10-26 ENCOUNTER — Encounter: Payer: Self-pay | Admitting: Certified Nurse Midwife

## 2021-03-08 ENCOUNTER — Other Ambulatory Visit: Payer: Self-pay

## 2021-03-08 ENCOUNTER — Encounter: Payer: Self-pay | Admitting: Certified Nurse Midwife

## 2021-03-08 ENCOUNTER — Ambulatory Visit (INDEPENDENT_AMBULATORY_CARE_PROVIDER_SITE_OTHER): Payer: BC Managed Care – PPO | Admitting: Certified Nurse Midwife

## 2021-03-08 ENCOUNTER — Other Ambulatory Visit (HOSPITAL_COMMUNITY)
Admission: RE | Admit: 2021-03-08 | Discharge: 2021-03-08 | Disposition: A | Discharge status: Home / Self Care | Payer: BC Managed Care – PPO | Source: Ambulatory Visit | Attending: Certified Nurse Midwife | Admitting: Certified Nurse Midwife

## 2021-03-08 VITALS — BP 125/85 | HR 61 | Ht 65.0 in | Wt 153.5 lb

## 2021-03-08 DIAGNOSIS — Z124 Encounter for screening for malignant neoplasm of cervix: Secondary | ICD-10-CM

## 2021-03-08 DIAGNOSIS — Z113 Encounter for screening for infections with a predominantly sexual mode of transmission: Secondary | ICD-10-CM

## 2021-03-08 DIAGNOSIS — Z01419 Encounter for gynecological examination (general) (routine) without abnormal findings: Secondary | ICD-10-CM

## 2021-03-08 DIAGNOSIS — Z1231 Encounter for screening mammogram for malignant neoplasm of breast: Secondary | ICD-10-CM

## 2021-03-08 NOTE — Progress Notes (Signed)
Declines flu 

## 2021-03-08 NOTE — Progress Notes (Signed)
GYNECOLOGY ANNUAL PREVENTATIVE CARE ENCOUNTER NOTE  History:     Melissa Benjamin is a 45 y.o. G26P1011 female here for a routine annual gynecologic exam.  Current complaints: none.   Denies abnormal vaginal bleeding, discharge, pelvic pain, problems with intercourse or other gynecologic concerns.     Social Relationship: long term relationship Living: with her partner and daughter Work: Civil engineer, contracting  Exercise: walking, active at job Smoke/Alcohol/drug use: alcohol 4 x wk, smoking when drinking, denies drug use.   Gynecologic History Patient's last menstrual period was 02/22/2021 (approximate). Contraception: condoms Last Pap: 04/08/2016. Results were: normal with negative HPV Last mammogram: 06/23/2020. Results were: normal   Upstream - 03/08/21 1459       Pregnancy Intention Screening   Does the patient want to become pregnant in the next year? No    Does the patient's partner want to become pregnant in the next year? No    Would the patient like to discuss contraceptive options today? N/A            The pregnancy intention screening data noted above was reviewed. Potential methods of contraception were discussed. The patient elected to proceed with condoms.  Obstetric History OB History  Gravida Para Term Preterm AB Living  2 1 1   1 1   SAB IAB Ectopic Multiple Live Births    1     1    # Outcome Date GA Lbr Len/2nd Weight Sex Delivery Anes PTL Lv  2 Term 2008   5 lb 3.2 oz (2.359 kg) F Vag-Spont   LIV  1 IAB 2002            Past Medical History:  Diagnosis Date   HPV in female     Past Surgical History:  Procedure Laterality Date   NO PAST SURGERIES      Current Outpatient Medications on File Prior to Visit  Medication Sig Dispense Refill   Multiple Vitamin (MULTIVITAMIN) tablet Take 1 tablet by mouth daily.     VITAMIN D PO Take by mouth.     No current facility-administered medications on file prior to visit.    No Known  Allergies  Social History:  reports that she has been smoking cigarettes. She has been smoking an average of .25 packs per day. She has never used smokeless tobacco. She reports current alcohol use. She reports that she does not use drugs.  Family History  Problem Relation Age of Onset   Breast cancer Mother        late 67's   Ovarian cancer Maternal Grandmother 77   Cancer Paternal Grandmother    Breast cancer Paternal Grandmother        late 86's   Colon cancer Neg Hx    Diabetes Neg Hx    Heart disease Neg Hx     The following portions of the patient's history were reviewed and updated as appropriate: allergies, current medications, past family history, past medical history, past social history, past surgical history and problem list.  Review of Systems Pertinent items noted in HPI and remainder of comprehensive ROS otherwise negative.  Physical Exam:  BP 125/85   Pulse 61   Ht 5\' 5"  (1.651 m)   Wt 153 lb 8 oz (69.6 kg)   LMP 02/22/2021 (Approximate)   BMI 25.54 kg/m  CONSTITUTIONAL: Well-developed, well-nourished female in no acute distress.  HENT:  Normocephalic, atraumatic, External right and left ear normal. Oropharynx is clear and moist EYES:  Conjunctivae and EOM are normal. Pupils are equal, round, and reactive to light. No scleral icterus.  NECK: Normal range of motion, supple, no masses.  Normal thyroid.  SKIN: Skin is warm and dry. No rash noted. Not diaphoretic. No erythema. No pallor. MUSCULOSKELETAL: Normal range of motion. No tenderness.  No cyanosis, clubbing, or edema.  2+ distal pulses. NEUROLOGIC: Alert and oriented to person, place, and time. Normal reflexes, muscle tone coordination.  PSYCHIATRIC: Normal mood and affect. Normal behavior. Normal judgment and thought content. CARDIOVASCULAR: Normal heart rate noted, regular rhythm RESPIRATORY: Clear to auscultation bilaterally. Effort and breath sounds normal, no problems with respiration noted. BREASTS:  Symmetric in size. No masses, tenderness, skin changes, nipple drainage, or lymphadenopathy bilaterally.  ABDOMEN: Soft, no distention noted.  No tenderness, rebound or guarding.  PELVIC: Normal appearing external genitalia and urethral meatus; normal appearing vaginal mucosa and cervix.  No abnormal discharge noted.  Pap smear obtained.  Normal uterine size, no other palpable masses, no uterine or adnexal tenderness.  .   Assessment and Plan:    1. Well woman exam with routine gynecological exam   Pap:  Will follow up results of pap smear and manage accordingly. Mammogram : ordered Labs: HIV, Hep c  Refills: none  Referral: none  Routine preventative health maintenance measures emphasized. Please refer to After Visit Summary for other counseling recommendations.      Doreene Burke, CNM [facility],  [facility]

## 2021-03-11 LAB — CYTOLOGY - PAP
Comment: NEGATIVE
Diagnosis: NEGATIVE
High risk HPV: NEGATIVE

## 2021-07-02 ENCOUNTER — Other Ambulatory Visit: Payer: Self-pay

## 2021-07-02 ENCOUNTER — Ambulatory Visit
Admission: RE | Admit: 2021-07-02 | Discharge: 2021-07-02 | Disposition: A | Discharge status: Home / Self Care | Payer: BC Managed Care – PPO | Source: Ambulatory Visit | Attending: Certified Nurse Midwife | Admitting: Certified Nurse Midwife

## 2021-07-02 DIAGNOSIS — Z01419 Encounter for gynecological examination (general) (routine) without abnormal findings: Secondary | ICD-10-CM | POA: Diagnosis present

## 2021-07-02 DIAGNOSIS — Z1231 Encounter for screening mammogram for malignant neoplasm of breast: Secondary | ICD-10-CM | POA: Diagnosis present

## 2022-03-09 ENCOUNTER — Ambulatory Visit (INDEPENDENT_AMBULATORY_CARE_PROVIDER_SITE_OTHER): Payer: BC Managed Care – PPO | Admitting: Certified Nurse Midwife

## 2022-03-09 ENCOUNTER — Encounter: Payer: Self-pay | Admitting: Certified Nurse Midwife

## 2022-03-09 VITALS — BP 156/88 | HR 62 | Ht 65.0 in | Wt 157.2 lb

## 2022-03-09 DIAGNOSIS — Z1329 Encounter for screening for other suspected endocrine disorder: Secondary | ICD-10-CM

## 2022-03-09 DIAGNOSIS — Z01419 Encounter for gynecological examination (general) (routine) without abnormal findings: Secondary | ICD-10-CM

## 2022-03-09 DIAGNOSIS — Z13220 Encounter for screening for lipoid disorders: Secondary | ICD-10-CM | POA: Diagnosis not present

## 2022-03-09 DIAGNOSIS — Z1231 Encounter for screening mammogram for malignant neoplasm of breast: Secondary | ICD-10-CM | POA: Diagnosis not present

## 2022-03-09 DIAGNOSIS — Z136 Encounter for screening for cardiovascular disorders: Secondary | ICD-10-CM

## 2022-03-09 MED ORDER — TETANUS-DIPHTH-ACELL PERTUSSIS 5-2.5-18.5 LF-MCG/0.5 IM SUSY
0.5000 mL | PREFILLED_SYRINGE | Freq: Once | INTRAMUSCULAR | Status: AC
  Administered 2022-03-09: 0.5 mL via INTRAMUSCULAR

## 2022-03-09 NOTE — Patient Instructions (Signed)
Preventive Care 40-44 Years Old, Female Preventive care refers to lifestyle choices and visits with your health care provider that can promote health and wellness. Preventive care visits are also called wellness exams. What can I expect for my preventive care visit? Counseling Your health care provider may ask you questions about your: Medical history, including: Past medical problems. Family medical history. Pregnancy history. Current health, including: Menstrual cycle. Method of birth control. Emotional well-being. Home life and relationship well-being. Sexual activity and sexual health. Lifestyle, including: Alcohol, nicotine or tobacco, and drug use. Access to firearms. Diet, exercise, and sleep habits. Work and work environment. Sunscreen use. Safety issues such as seatbelt and bike helmet use. Physical exam Your health care provider will check your: Height and weight. These may be used to calculate your BMI (body mass index). BMI is a measurement that tells if you are at a healthy weight. Waist circumference. This measures the distance around your waistline. This measurement also tells if you are at a healthy weight and may help predict your risk of certain diseases, such as type 2 diabetes and high blood pressure. Heart rate and blood pressure. Body temperature. Skin for abnormal spots. What immunizations do I need?  Vaccines are usually given at various ages, according to a schedule. Your health care provider will recommend vaccines for you based on your age, medical history, and lifestyle or other factors, such as travel or where you work. What tests do I need? Screening Your health care provider may recommend screening tests for certain conditions. This may include: Lipid and cholesterol levels. Diabetes screening. This is done by checking your blood sugar (glucose) after you have not eaten for a while (fasting). Pelvic exam and Pap test. Hepatitis B test. Hepatitis C  test. HIV (human immunodeficiency virus) test. STI (sexually transmitted infection) testing, if you are at risk. Lung cancer screening. Colorectal cancer screening. Mammogram. Talk with your health care provider about when you should start having regular mammograms. This may depend on whether you have a family history of breast cancer. BRCA-related cancer screening. This may be done if you have a family history of breast, ovarian, tubal, or peritoneal cancers. Bone density scan. This is done to screen for osteoporosis. Talk with your health care provider about your test results, treatment options, and if necessary, the need for more tests. Follow these instructions at home: Eating and drinking  Eat a diet that includes fresh fruits and vegetables, whole grains, lean protein, and low-fat dairy products. Take vitamin and mineral supplements as recommended by your health care provider. Do not drink alcohol if: Your health care provider tells you not to drink. You are pregnant, may be pregnant, or are planning to become pregnant. If you drink alcohol: Limit how much you have to 0-1 drink a day. Know how much alcohol is in your drink. In the [place] one drink equals one 12 oz bottle of beer (355 mL), one 5 oz glass of wine (148 mL), or one 1 oz glass of hard liquor (44 mL). Lifestyle Brush your teeth every morning and night with fluoride toothpaste. Floss one time each day. Exercise for at least 30 minutes 5 or more days each week. Do not use any products that contain nicotine or tobacco. These products include cigarettes, chewing tobacco, and vaping devices, such as e-cigarettes. If you need help quitting, ask your health care provider. Do not use drugs. If you are sexually active, practice safe sex. Use a condom or other form of protection to   prevent STIs. If you do not wish to become pregnant, use a form of birth control. If you plan to become pregnant, see your health care provider for a  prepregnancy visit. Take aspirin only as told by your health care provider. Make sure that you understand how much to take and what form to take. Work with your health care provider to find out whether it is safe and beneficial for you to take aspirin daily. Find healthy ways to manage stress, such as: Meditation, yoga, or listening to music. Journaling. Talking to a trusted person. Spending time with friends and family. Minimize exposure to UV radiation to reduce your risk of skin cancer. Safety Always wear your seat belt while driving or riding in a vehicle. Do not drive: If you have been drinking alcohol. Do not ride with someone who has been drinking. When you are tired or distracted. While texting. If you have been using any mind-altering substances or drugs. Wear a helmet and other protective equipment during sports activities. If you have firearms in your house, make sure you follow all gun safety procedures. Seek help if you have been physically or sexually abused. What's next? Visit your health care provider once a year for an annual wellness visit. Ask your health care provider how often you should have your eyes and teeth checked. Stay up to date on all vaccines. This information is not intended to replace advice given to you by your health care provider. Make sure you discuss any questions you have with your health care provider. Document Revised: 12/02/2020 Document Reviewed: 12/02/2020 Elsevier Patient Education  [place].

## 2022-03-09 NOTE — Progress Notes (Signed)
GYNECOLOGY ANNUAL PREVENTATIVE CARE ENCOUNTER NOTE  History:     Miniya Miguez is a 46 y.o. G20P1011 female here for a routine annual gynecologic exam.  Current complaints: none.   Denies abnormal vaginal bleeding, discharge, pelvic pain, problems with intercourse or other gynecologic concerns.     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   Gynecologic History Patient's last menstrual period was 03/04/2022 (exact date). Contraception: condoms and pull out method Last Pap: 03/08/2021. Results were: normal with negative HPV Last mammogram: 07/02/2021. Results were: normal   Upstream - 03/09/22 1430       Pregnancy Intention Screening   Does the patient want to become pregnant in the next year? No    Does the patient's partner want to become pregnant in the next year? No    Would the patient like to discuss contraceptive options today? No            The pregnancy intention screening data noted above was reviewed. Potential methods of contraception were discussed. The patient elected to proceed with none.  Obstetric History OB History  Gravida Para Term Preterm AB Living  2 1 1   1 1   SAB IAB Ectopic Multiple Live Births    1     1    # Outcome Date GA Lbr Len/2nd Weight Sex Delivery Anes PTL Lv  2 Term 2008   5 lb 3.2 oz (2.359 kg) F Vag-Spont   LIV  1 IAB 2002            Past Medical History:  Diagnosis Date   HPV in female     Past Surgical History:  Procedure Laterality Date   NO PAST SURGERIES      Current Outpatient Medications on File Prior to Visit  Medication Sig Dispense Refill   Multiple Vitamin (MULTIVITAMIN) tablet Take 1 tablet by mouth daily.     VITAMIN D PO Take by mouth.     No current facility-administered medications on file prior to visit.    No Known  Allergies  Social History:  reports that she has been smoking cigarettes. She has been smoking an average of .25 packs per day. She has never used smokeless tobacco. She reports current alcohol use. She reports that she does not use drugs.  Family History  Problem Relation Age of Onset   Breast cancer Mother        late 53's   Ovarian cancer Maternal Grandmother 90   Cancer Paternal Grandmother    Breast cancer Paternal Grandmother        late 81's   Colon cancer Neg Hx    Diabetes Neg Hx    Heart disease Neg Hx     The following portions of the patient's history were reviewed and updated as appropriate: allergies, current medications, past family history, past medical history, past social history, past surgical history and problem list.  Review of Systems Pertinent items noted in HPI and remainder of comprehensive ROS otherwise negative.  Physical Exam:  BP (!) 156/88   Pulse 62   Ht 5\' 5"  (1.651 m)   Wt 157 lb 3.2 oz (71.3 kg)   LMP 03/04/2022 (Exact Date)   BMI 26.16 kg/m  CONSTITUTIONAL: Well-developed, well-nourished female in no acute distress.  HENT:  Normocephalic, atraumatic, External right and left ear normal.  Oropharynx is clear and moist EYES: Conjunctivae and EOM are normal. Pupils are equal, round, and reactive to light. No scleral icterus.  NECK: Normal range of motion, supple, no masses.  Normal thyroid.  SKIN: Skin is warm and dry. No rash noted. Not diaphoretic. No erythema. No pallor. MUSCULOSKELETAL: Normal range of motion. No tenderness.  No cyanosis, clubbing, or edema.  2+ distal pulses. NEUROLOGIC: Alert and oriented to person, place, and time. Normal reflexes, muscle tone coordination.  PSYCHIATRIC: Normal mood and affect. Normal behavior. Normal judgment and thought content. CARDIOVASCULAR: Normal heart rate noted, regular rhythm RESPIRATORY: Clear to auscultation bilaterally. Effort and breath sounds normal, no problems with respiration  noted. BREASTS: Symmetric in size. No masses, tenderness, skin changes, nipple drainage, or lymphadenopathy bilaterally.  ABDOMEN: Soft, no distention noted.  No tenderness, rebound or guarding.  PELVIC: Normal appearing external genitalia and urethral meatus; normal appearing vaginal mucosa and cervix.  No abnormal discharge noted.  Pap smear not due.  Normal uterine size, no other palpable masses, no uterine or adnexal tenderness.  .   Assessment and Plan:    1. Well woman exam with routine gynecological exam  Pap: not due 2027 Mammogram : ordered Labs: TSH, Lipid  Refills: none  Referral: none  Routine preventative health maintenance measures emphasized. Please refer to After Visit Summary for other counseling recommendations.      Philip Aspen, CNM [facility]

## 2022-03-10 ENCOUNTER — Encounter: Payer: Self-pay | Admitting: Certified Nurse Midwife

## 2022-03-10 LAB — LIPID PANEL
Chol/HDL Ratio: 2.2 ratio (ref 0.0–4.4)
Cholesterol, Total: 201 mg/dL — ABNORMAL HIGH (ref 100–199)
HDL: 92 mg/dL (ref >39)
LDL Chol Calc (NIH): 92 mg/dL (ref 0–99)
Triglycerides: 98 mg/dL (ref 0–149)
VLDL Cholesterol Cal: 17 mg/dL (ref 5–40)

## 2022-03-10 LAB — THYROID PANEL WITH TSH
Free Thyroxine Index: 1.4 (ref 1.2–4.9)
T3 Uptake Ratio: 26 % (ref 24–39)
T4, Total: 5.4 ug/dL (ref 4.5–12.0)
TSH: 1.75 u[IU]/mL (ref 0.450–4.500)

## 2022-07-12 ENCOUNTER — Ambulatory Visit
Admission: RE | Admit: 2022-07-12 | Discharge: 2022-07-12 | Disposition: A | Discharge status: Home / Self Care | Payer: BC Managed Care – PPO | Source: Ambulatory Visit | Attending: Certified Nurse Midwife | Admitting: Certified Nurse Midwife

## 2022-07-12 DIAGNOSIS — Z1231 Encounter for screening mammogram for malignant neoplasm of breast: Secondary | ICD-10-CM | POA: Diagnosis not present

## 2022-07-12 DIAGNOSIS — Z01419 Encounter for gynecological examination (general) (routine) without abnormal findings: Secondary | ICD-10-CM

## 2023-07-20 ENCOUNTER — Other Ambulatory Visit (HOSPITAL_COMMUNITY)
Admission: RE | Admit: 2023-07-20 | Discharge: 2023-07-20 | Disposition: A | Discharge status: Home / Self Care | Payer: BC Managed Care – PPO | Source: Ambulatory Visit | Attending: Certified Nurse Midwife | Admitting: Certified Nurse Midwife

## 2023-07-20 ENCOUNTER — Encounter: Payer: Self-pay | Admitting: Certified Nurse Midwife

## 2023-07-20 ENCOUNTER — Ambulatory Visit (INDEPENDENT_AMBULATORY_CARE_PROVIDER_SITE_OTHER): Payer: BC Managed Care – PPO | Admitting: Certified Nurse Midwife

## 2023-07-20 VITALS — BP 134/72 | HR 76 | Ht 65.0 in | Wt 152.0 lb

## 2023-07-20 DIAGNOSIS — Z1151 Encounter for screening for human papillomavirus (HPV): Secondary | ICD-10-CM | POA: Diagnosis present

## 2023-07-20 DIAGNOSIS — Z1231 Encounter for screening mammogram for malignant neoplasm of breast: Secondary | ICD-10-CM

## 2023-07-20 DIAGNOSIS — Z01419 Encounter for gynecological examination (general) (routine) without abnormal findings: Secondary | ICD-10-CM

## 2023-07-20 DIAGNOSIS — Z124 Encounter for screening for malignant neoplasm of cervix: Secondary | ICD-10-CM | POA: Insufficient documentation

## 2023-07-20 NOTE — Progress Notes (Signed)
ANNUAL EXAM Patient name: Melissa Benjamin MRN [id number]  Date of birth: 13-May-1976 Chief Complaint:   Annual Exam  History of Present Illness:   Melissa Benjamin is a 48 y.o. G96P1011 Caucasian female being seen today for a routine annual exam.  Current complaints: none, feeling well. Beginning to have occasional night sweat but cycles remain regular.  Patient's last menstrual period was 06/27/2023.   Upstream - 07/20/23 1356       Pregnancy Intention Screening   Does the patient want to become pregnant in the next year? No    Does the patient's partner want to become pregnant in the next year? No    Would the patient like to discuss contraceptive options today? No      Contraception Wrap Up   Current Method Withdrawal or Other Method    End Method Withdrawal or Other Method    Contraception Counseling Provided Yes    How was the end contraceptive method provided? N/A            The pregnancy intention screening data noted above was reviewed. Potential methods of contraception were discussed. The patient elected to proceed with Withdrawal or Other Method.      Component Value Date/Time   DIAGPAP  03/08/2021 1536    - Negative for intraepithelial lesion or malignancy (NILM)   HPVHIGH Negative 03/08/2021 1536   ADEQPAP  03/08/2021 1536    Satisfactory for evaluation; transformation zone component PRESENT.       Last pap 2022. Results were: NILM w/ HRHPV negative. H/O abnormal pap: no Last mammogram: 06/2022. Results were: normal. Family h/o breast cancer: yes mother & paternal grandmother Last colonoscopy: never. Results were: N/A. Family h/o colorectal cancer: no     07/20/2023    1:56 PM  Depression screen PHQ 2/9  Decreased Interest 0  Down, Depressed, Hopeless 0  PHQ - 2 Score 0         No data to display           Review of Systems:   Pertinent items are noted in HPI Denies any headaches, blurred vision, fatigue, shortness of breath, chest pain,  abdominal pain, abnormal vaginal discharge/itching/odor/irritation, problems with periods, bowel movements, urination, or intercourse unless otherwise stated above. Pertinent History Reviewed:  Reviewed past medical,surgical, social and family history.  Reviewed problem list, medications and allergies. Physical Assessment:   Vitals:   07/20/23 1324  BP: 134/72  Pulse: 76  Weight: 152 lb (68.9 kg)  Height: 5\' 5"  (1.651 m)  Body mass index is 25.29 kg/m.       Physical Exam Vitals reviewed.  Constitutional:      Appearance: Normal appearance.  HENT:     Head: Normocephalic.  Neck:     Thyroid: No thyroid mass or thyromegaly.  Cardiovascular:     Rate and Rhythm: Normal rate and regular rhythm.     Heart sounds: Normal heart sounds.  Pulmonary:     Effort: Pulmonary effort is normal.     Breath sounds: Normal breath sounds.  Chest:  Breasts:    Tanner Score is 5.     Right: Normal.     Left: Normal.  Abdominal:     General: Abdomen is flat.     Palpations: Abdomen is soft.     Tenderness: There is no abdominal tenderness.  Genitourinary:    General: Normal vulva.     Vagina: Normal.     Cervix: Normal.     Uterus: Not  fixed and not tender.      Adnexa:        Right: No mass or tenderness.         Left: No mass or tenderness.    Musculoskeletal:     Cervical back: Neck supple. No tenderness.  Skin:    General: Skin is warm and dry.  Neurological:     General: No focal deficit present.     Mental Status: She is alert and oriented to person, place, and time.  Psychiatric:        Mood and Affect: Mood normal.        Behavior: Behavior normal.      No results found for this or any previous visit (from the past 24 hours).  Assessment & Plan:  1. Well woman exam (Primary) - MM 3D SCREENING MAMMOGRAM BILATERAL BREAST; Future - Cytology - PAP  2. Cervical cancer screening - Cytology - PAP  3. Encounter for screening for human papillomavirus (HPV) - Cytology -  PAP  4. Breast cancer screening by mammogram - MM 3D SCREENING MAMMOGRAM BILATERAL BREAST; Future   Mammogram:  ordered today Colonoscopy:  considering Cologard, will check with insurance to determine cost   Orders Placed This Encounter  Procedures   MM 3D SCREENING MAMMOGRAM BILATERAL BREAST    Meds: No orders of the defined types were placed in this encounter.   Follow-up: No follow-ups on file.  Dominica Severin, CNM 07/20/2023 1:57 PM

## 2023-07-20 NOTE — Patient Instructions (Signed)
Health Maintenance, Female Adopting a healthy lifestyle and getting preventive care are important in promoting health and wellness. Ask your health care provider about: The right schedule for you to have regular tests and exams. Things you can do on your own to prevent diseases and keep yourself healthy. What should I know about diet, weight, and exercise? Eat a healthy diet  Eat a diet that includes plenty of vegetables, fruits, low-fat dairy products, and lean protein. Do not eat a lot of foods that are high in solid fats, added sugars, or sodium. Maintain a healthy weight Body mass index (BMI) is used to identify weight problems. It estimates body fat based on height and weight. Your health care provider can help determine your BMI and help you achieve or maintain a healthy weight. Get regular exercise Get regular exercise. This is one of the most important things you can do for your health. Most adults should: Exercise for at least 150 minutes each week. The exercise should increase your heart rate and make you sweat (moderate-intensity exercise). Do strengthening exercises at least twice a week. This is in addition to the moderate-intensity exercise. Spend less time sitting. Even light physical activity can be beneficial. Watch cholesterol and blood lipids Have your blood tested for lipids and cholesterol at 46 years of age, then have this test every 5 years. Have your cholesterol levels checked more often if: Your lipid or cholesterol levels are high. You are older than 46 years of age. You are at high risk for heart disease. What should I know about cancer screening? Depending on your health history and family history, you may need to have cancer screening at various ages. This may include screening for: Breast cancer. Cervical cancer. Colorectal cancer. Skin cancer. Lung cancer. What should I know about heart disease, diabetes, and high blood pressure? Blood pressure and heart  disease High blood pressure causes heart disease and increases the risk of stroke. This is more likely to develop in people who have high blood pressure readings or are overweight. Have your blood pressure checked: Every 3-5 years if you are 70-58 years of age. Every year if you are 107 years old or older. Diabetes Have regular diabetes screenings. This checks your fasting blood sugar level. Have the screening done: Once every three years after age 98 if you are at a normal weight and have a low risk for diabetes. More often and at a younger age if you are overweight or have a high risk for diabetes. What should I know about preventing infection? Hepatitis B If you have a higher risk for hepatitis B, you should be screened for this virus. Talk with your health care provider to find out if you are at risk for hepatitis B infection. Hepatitis C Testing is recommended for: Everyone born from [birth year] through 1963. Anyone with known risk factors for hepatitis C. Sexually transmitted infections (STIs) Get screened for STIs, including gonorrhea and chlamydia, if: You are sexually active and are younger than 46 years of age. You are older than 46 years of age and your health care provider tells you that you are at risk for this type of infection. Your sexual activity has changed since you were last screened, and you are at increased risk for chlamydia or gonorrhea. Ask your health care provider if you are at risk. Ask your health care provider about whether you are at high risk for HIV. Your health care provider may recommend a prescription medicine to help prevent HIV  infection. If you choose to take medicine to prevent HIV, you should first get tested for HIV. You should then be tested every 3 months for as long as you are taking the medicine. Pregnancy If you are about to stop having your period (premenopausal) and you may become pregnant, seek counseling before you get pregnant. Take 400 to 800  micrograms (mcg) of folic acid every day if you become pregnant. Ask for birth control (contraception) if you want to prevent pregnancy. Osteoporosis and menopause Osteoporosis is a disease in which the bones lose minerals and strength with aging. This can result in bone fractures. If you are 3 years old or older, or if you are at risk for osteoporosis and fractures, ask your health care provider if you should: Be screened for bone loss. Take a calcium or vitamin D supplement to lower your risk of fractures. Be given hormone replacement therapy (HRT) to treat symptoms of menopause. Follow these instructions at home: Alcohol use Do not drink alcohol if: Your health care provider tells you not to drink. You are pregnant, may be pregnant, or are planning to become pregnant. If you drink alcohol: Limit how much you have to: 0-1 drink a day. Know how much alcohol is in your drink. In the [place] one drink equals one 12 oz bottle of beer (355 mL), one 5 oz glass of wine (148 mL), or one 1 oz glass of hard liquor (44 mL). Lifestyle Do not use any products that contain nicotine or tobacco. These products include cigarettes, chewing tobacco, and vaping devices, such as e-cigarettes. If you need help quitting, ask your health care provider. Do not use street drugs. Do not share needles. Ask your health care provider for help if you need support or information about quitting drugs. General instructions Schedule regular health, dental, and eye exams. Stay current with your vaccines. Tell your health care provider if: You often feel depressed. You have ever been abused or do not feel safe at home. Summary Adopting a healthy lifestyle and getting preventive care are important in promoting health and wellness. Follow your health care provider's instructions about healthy diet, exercising, and getting tested or screened for diseases. Follow your health care provider's instructions on monitoring your  cholesterol and blood pressure. This information is not intended to replace advice given to you by your health care provider. Make sure you discuss any questions you have with your health care provider. Document Revised: 10/26/2020 Document Reviewed: 10/26/2020 [employer] Patient Education  2024 [employer] Colonoscopy, Adult A colonoscopy is a procedure to look at the entire large intestine. This procedure is done using a long, thin, flexible tube that has a camera on the end. You may have a colonoscopy: As a part of normal colorectal screening. If you have certain symptoms, such as: A low number of red blood cells in your blood (anemia). Diarrhea that does not go away. Pain in your abdomen. Blood in your stool. A colonoscopy can help screen for and diagnose medical problems, including: An abnormal growth of cells or tissue (tumor). Abnormal growths within the lining of your intestine (polyps). Inflammation. Areas of bleeding. Tell your health care provider about: Any allergies you have. All medicines you are taking, including vitamins, herbs, eye drops, creams, and over-the-counter medicines. Any problems you or family members have had with anesthetic medicines. Any bleeding problems you have. Any surgeries you have had. Any medical conditions you have. Any problems you have had with having bowel movements. Whether you are  pregnant or may be pregnant. What are the risks? Generally, this is a safe procedure. However, problems may occur, including: Bleeding. Damage to your intestine. Allergic reactions to medicines given during the procedure. Infection. This is rare. What happens before the procedure? Eating and drinking restrictions Follow instructions from your health care provider about eating or drinking restrictions, which may include: A few days before the procedure: Follow a low-fiber diet. Avoid nuts, seeds, dried fruit, raw fruits, and vegetables. 1-3 days before the  procedure: Eat only gelatin dessert or ice pops. Drink only clear liquids, such as water, clear juice, clear broth or bouillon, black coffee or tea, or clear soft drinks or sports drinks. Avoid liquids that contain red or purple dye. The day of the procedure: Do not eat solid foods. You may continue to drink clear liquids until up to 2 hours before the procedure. Do not eat or drink anything starting 2 hours before the procedure, or within the time period that your health care provider recommends. Bowel prep If you were prescribed a bowel prep to take by mouth (orally) to clean out your colon: Take it as told by your health care provider. Starting the day before your procedure, you will need to drink a large amount of liquid medicine. The liquid will cause you to have many bowel movements of loose stool until your stool becomes almost clear or light green. If your skin or the opening between the buttocks (anus) gets irritated from diarrhea, you may relieve the irritation using: Wipes with medicine in them, such as adult wet wipes with aloe and vitamin E. A product to soothe skin, such as petroleum jelly. If you vomit while drinking the bowel prep: Take a break for up to 60 minutes. Begin the bowel prep again. Call your health care provider if you keep vomiting or you cannot take the bowel prep without vomiting. To clean out your colon, you may also be given: Laxative medicines. These help you have a bowel movement. Instructions for enema use. An enema is liquid medicine injected into your rectum. Medicines Ask your health care provider about: Changing or stopping your regular medicines or supplements. This is especially important if you are taking iron supplements, diabetes medicines, or blood thinners. Taking medicines such as aspirin and ibuprofen. These medicines can thin your blood. Do not take these medicines unless your health care provider tells you to take them. Taking  over-the-counter medicines, vitamins, herbs, and supplements. General instructions Ask your health care provider what steps will be taken to help prevent infection. These may include washing skin with a germ-killing soap. If you will be going home right after the procedure, plan to have a responsible adult: Take you home from the hospital or clinic. You will not be allowed to drive. Care for you for the time you are told. What happens during the procedure?  An IV will be inserted into one of your veins. You will be given a medicine to make you fall asleep (general anesthetic). You will lie on your side with your knees bent. A lubricant will be put on the tube. Then the tube will be: Inserted into your anus. Gently eased through all parts of your large intestine. Air will be sent into your colon to keep it open. This may cause some pressure or cramping. Images will be taken with the camera and will appear on a screen. A small tissue sample may be removed to be looked at under a microscope (biopsy). The tissue may  be sent to a lab for testing if any signs of problems are found. If small polyps are found, they may be removed and checked for cancer cells. When the procedure is finished, the tube will be removed. The procedure may vary among health care providers and hospitals. What happens after the procedure? Your blood pressure, heart rate, breathing rate, and blood oxygen level will be monitored until you leave the hospital or clinic. You may have a small amount of blood in your stool. You may pass gas and have mild cramping or bloating in your abdomen. This is caused by the air that was used to open your colon during the exam. If you were given a sedative during the procedure, it can affect you for several hours. Do not drive or operate machinery until your health care provider says that it is safe. It is up to you to get the results of your procedure. Ask your health care provider, or the  department that is doing the procedure, when your results will be ready. Summary A colonoscopy is a procedure to look at the entire large intestine. Follow instructions from your health care provider about eating and drinking before the procedure. If you were prescribed an oral bowel prep to clean out your colon, take it as told by your health care provider. During the colonoscopy, a flexible tube with a camera on its end is inserted into the anus and then passed into all parts of the large intestine. This information is not intended to replace advice given to you by your health care provider. Make sure you discuss any questions you have with your health care provider. Document Revised: 07/19/2022 Document Reviewed: 01/27/2021 Else[employer] Patient Education  2024 [employer].

## 2023-07-25 LAB — CYTOLOGY - PAP
Adequacy: ABSENT
Comment: NEGATIVE
Diagnosis: NEGATIVE
High risk HPV: NEGATIVE

## 2023-07-27 ENCOUNTER — Encounter: Payer: Self-pay | Admitting: Certified Nurse Midwife

## 2023-08-31 ENCOUNTER — Ambulatory Visit
Admission: RE | Admit: 2023-08-31 | Discharge: 2023-08-31 | Disposition: A | Discharge status: Home / Self Care | Payer: BC Managed Care – PPO | Source: Ambulatory Visit | Attending: Certified Nurse Midwife | Admitting: Certified Nurse Midwife

## 2023-08-31 DIAGNOSIS — Z1231 Encounter for screening mammogram for malignant neoplasm of breast: Secondary | ICD-10-CM | POA: Diagnosis present

## 2023-08-31 DIAGNOSIS — Z01419 Encounter for gynecological examination (general) (routine) without abnormal findings: Secondary | ICD-10-CM

## 2024-08-22 ENCOUNTER — Ambulatory Visit: Admitting: Registered Nurse
